# Patient Record
Sex: Female | Born: 1965 | Hispanic: No | Smoking: Current every day smoker
Health system: Southern US, Community
[De-identification: ages and names within clinical notes are randomized; demographics above are authoritative.]

## PROBLEM LIST (undated history)

## (undated) DIAGNOSIS — M1990 Unspecified osteoarthritis, unspecified site: Secondary | ICD-10-CM

## (undated) DIAGNOSIS — M5136 Other intervertebral disc degeneration, lumbar region: Secondary | ICD-10-CM

## (undated) DIAGNOSIS — F32A Depression, unspecified: Secondary | ICD-10-CM

## (undated) DIAGNOSIS — J189 Pneumonia, unspecified organism: Secondary | ICD-10-CM

## (undated) DIAGNOSIS — S92901A Unspecified fracture of right foot, initial encounter for closed fracture: Secondary | ICD-10-CM

## (undated) DIAGNOSIS — R519 Headache, unspecified: Secondary | ICD-10-CM

## (undated) DIAGNOSIS — F419 Anxiety disorder, unspecified: Secondary | ICD-10-CM

## (undated) DIAGNOSIS — D649 Anemia, unspecified: Secondary | ICD-10-CM

## (undated) DIAGNOSIS — I639 Cerebral infarction, unspecified: Secondary | ICD-10-CM

## (undated) DIAGNOSIS — M5126 Other intervertebral disc displacement, lumbar region: Secondary | ICD-10-CM

## (undated) HISTORY — PX: ABDOMINAL HYSTERECTOMY: SUR658

## (undated) HISTORY — DX: Other intervertebral disc displacement, lumbar region: M51.26

## (undated) HISTORY — DX: Unspecified fracture of right foot, initial encounter for closed fracture: S92.901A

## (undated) HISTORY — PX: HYSTEROTOMY: SHX1776

## (undated) HISTORY — DX: Other intervertebral disc degeneration, lumbar region: M51.36

## (undated) HISTORY — DX: Cerebral infarction, unspecified: I63.9

---

## 2017-10-31 ENCOUNTER — Telehealth: Payer: Self-pay | Admitting: Neurology

## 2017-10-31 ENCOUNTER — Encounter: Payer: Self-pay | Admitting: Neurology

## 2017-10-31 ENCOUNTER — Ambulatory Visit (INDEPENDENT_AMBULATORY_CARE_PROVIDER_SITE_OTHER): Payer: 59 | Admitting: Neurology

## 2017-10-31 VITALS — BP 160/94 | HR 82 | Ht 65.0 in | Wt 196.2 lb

## 2017-10-31 DIAGNOSIS — R269 Unspecified abnormalities of gait and mobility: Secondary | ICD-10-CM | POA: Diagnosis not present

## 2017-10-31 DIAGNOSIS — G3281 Cerebellar ataxia in diseases classified elsewhere: Secondary | ICD-10-CM | POA: Diagnosis not present

## 2017-10-31 DIAGNOSIS — M542 Cervicalgia: Secondary | ICD-10-CM | POA: Insufficient documentation

## 2017-10-31 MED ORDER — RIZATRIPTAN BENZOATE 10 MG PO TBDP: 10.0000 mg | ORAL_TABLET | ORAL | 6 refills | Status: DC | PRN

## 2017-10-31 MED ORDER — ONDANSETRON 4 MG PO TBDP: 4.0000 mg | ORAL_TABLET | Freq: Three times a day (TID) | ORAL | 6 refills | Status: DC | PRN

## 2017-10-31 MED ORDER — DULOXETINE HCL 60 MG PO CPEP: 60.0000 mg | ORAL_CAPSULE | Freq: Every day | ORAL | 12 refills | Status: DC

## 2017-10-31 MED ORDER — NORTRIPTYLINE HCL 25 MG PO CAPS: 50.0000 mg | ORAL_CAPSULE | Freq: Every day | ORAL | 11 refills | Status: DC

## 2017-10-31 NOTE — Telephone Encounter (Signed)
[employer] order sent to [facility]. They will obtain the auth and they will reach out to the pt to schedule.

## 2017-10-31 NOTE — Progress Notes (Signed)
PATIENT: Christine CavalierBetty Perkins DOB: 01/06/1966  Chief Complaint  Patient presents with  . Cerebrovascular Accident    She is here with her husband, Annette StableBill.  She would like to review her recent MRI showing stroke activity (completed on 10/19/17).  She has continued to have numbness/tingling in her right arm and an unsteady gait.  She is currently in a boot for a foot fracture.  She has not been referred to any type of therapy.  Marland Kitchen. PCP    Dulce SellarHudnell, Stephanie, NP     HISTORICAL  Christine CavalierBetty Perkins is a 52 year old female, seen in request by her primary care nurse practitioner Dulce SellarHudnell, Stephanie for evaluation of stroke, initial evaluation was on October 31, 2017.  Patient had gradual onset balance issues since January 2019, she also complains of neck pain, upper extremity, right hand numbness, weakness, difficulty making a tight grip, sometimes left hand paresthesia, she also complains of urinary urgency  She tripped and fell in June 2019 with right fifth metatarsal fracture,  [facility] on October 17, 2017, left frontal subcortical small vessel disease no acute abnormality  REVIEW OF SYSTEMS: Full 14 system review of systems performed and notable only for as above  ALLERGIES: No Known Allergies  HOME MEDICATIONS: Current Outpatient Medications  Medication Sig Dispense Refill  . cyclobenzaprine (FLEXERIL) 10 MG tablet Take 10 mg by mouth 3 (three) times daily as needed for muscle spasms.    . Ibuprofen-Famotidine (DUEXIS) 800-26.6 MG TABS Take by mouth 3 (three) times daily as needed.     No current facility-administered medications for this visit.     PAST MEDICAL HISTORY: Past Medical History:  Diagnosis Date  . Bulging lumbar disc   . CVA (cerebral vascular accident) (HCC)   . Foot fracture, right     PAST SURGICAL HISTORY: Past Surgical History:  Procedure Laterality Date  . ABDOMINAL HYSTERECTOMY     partial  . HYSTEROTOMY     partial     FAMILY HISTORY: Family History  Problem Relation Age of Onset  . Hypertension Mother   . Heart attack Mother   . COPD Mother   . Lung cancer Father   . COPD Father     SOCIAL HISTORY: Social History   Socioeconomic History  . Marital status: Unknown    Spouse name: Not on file  . Number of children: 1  . Years of education: 3912  . Highest education level: High school graduate  Occupational History  . Occupation: Educational psychologistatch worker  Social Needs  . Financial resource strain: Not on file  . Food insecurity:    Worry: Not on file    Inability: Not on file  . Transportation needs:    Medical: Not on file    Non-medical: Not on file  Tobacco Use  . Smoking status: Current Every Day Smoker    Types: Cigarettes  . Smokeless tobacco: Never Used  . Tobacco comment: 0.5 to 1 ppd  Substance and Sexual Activity  . Alcohol use: Yes    Comment: social  . Drug use: Never  . Sexual activity: Not on file  Lifestyle  . Physical activity:    Days per week: Not on file    Minutes per session: Not on file  . Stress: Not on file  Relationships  . Social connections:    Talks on phone: Not on file    Gets together: Not on file    Attends religious service: Not on file  Active member of club or organization: Not on file    Attends meetings of clubs or organizations: Not on file    Relationship status: Not on file  . Intimate partner violence:    Fear of current or ex partner: Not on file    Emotionally abused: Not on file    Physically abused: Not on file    Forced sexual activity: Not on file  Other Topics Concern  . Not on file  Social History Narrative   Lives at home with her husband, [name].   Right-handed.   One cup coffee twice weekly.     PHYSICAL EXAM   Vitals:   10/31/17 0846  BP: (!) 160/94  Pulse: 82  Weight: 196 lb 4 oz (89 kg)  Height: 5\' 5"  (1.651 m)    Not recorded      Body mass index is 32.66 kg/m.  PHYSICAL EXAMNIATION:  Gen: NAD,  conversant, well nourised, obese, well groomed                     Cardiovascular: Regular rate rhythm, no peripheral edema, warm, nontender. Eyes: Conjunctivae clear without exudates or hemorrhage Neck: Supple, no carotid bruits. Pulmonary: Clear to auscultation bilaterally   NEUROLOGICAL EXAM:  MENTAL STATUS: Speech:    Speech is normal; fluent and spontaneous with normal comprehension.  Cognition:     Orientation to time, place and person     Normal recent and remote memory     Normal Attention span and concentration     Normal Language, naming, repeating,spontaneous speech     Fund of knowledge   CRANIAL NERVES: CN II: Visual fields are full to confrontation. Fundoscopic exam is normal with sharp discs and no vascular changes. Pupils are round equal and briskly reactive to light. CN III, IV, VI: extraocular movement are normal. No ptosis. CN V: Facial sensation is intact to pinprick in all 3 divisions bilaterally. Corneal responses are intact.  CN VII: Face is symmetric with normal eye closure and smile. CN VIII: Hearing is normal to rubbing fingers CN IX, X: Palate elevates symmetrically. Phonation is normal. CN XI: Head turning and shoulder shrug are intact CN XII: Tongue is midline with normal movements and no atrophy.  MOTOR: There is no pronator drift of out-stretched arms. Muscle bulk and tone are normal. Muscle strength is normal.  REFLEXES: Reflexes are 3 and symmetric at the biceps, triceps, knees, and nonsustained left side ankle clonus, plantar responses are flexor on the left side.  SENSORY: Intact to light touch, pinprick, positional sensation and vibratory sensation are intact in fingers and toes.  COORDINATION: Rapid alternating movements and fine finger movements are intact. There is no dysmetria on finger-to-nose and heel-knee-shin.    GAIT/STANCE: Antalgic due to right fifth metatarsal fracture, wearing right boot   DIAGNOSTIC DATA (LABS, IMAGING,  TESTING) - I reviewed patient records, labs, notes, testing and imaging myself where available.   ASSESSMENT AND PLAN  Austine Kelsay is a 52 y.o. female   Gait abnormality  Need to rule out cervical spondylitic myelopathy  MRI of cervical spine     Levert Feinstein, M.D. Ph.D.  [facility] [address] [place], [place] [address] Ph: [phone] Fax: [phone]  CC: Dulce Sellar, NP

## 2017-11-09 ENCOUNTER — Other Ambulatory Visit: Payer: Self-pay | Admitting: Neurology

## 2017-11-12 ENCOUNTER — Ambulatory Visit
Admission: RE | Admit: 2017-11-12 | Discharge: 2017-11-12 | Disposition: A | Discharge status: Home / Self Care | Payer: Self-pay | Source: Ambulatory Visit | Attending: Neurology | Admitting: Neurology

## 2017-11-12 DIAGNOSIS — G3281 Cerebellar ataxia in diseases classified elsewhere: Secondary | ICD-10-CM

## 2017-11-15 ENCOUNTER — Telehealth: Payer: Self-pay | Admitting: Neurology

## 2017-11-15 ENCOUNTER — Telehealth: Payer: Self-pay | Admitting: *Deleted

## 2017-11-15 DIAGNOSIS — Z0289 Encounter for other administrative examinations: Secondary | ICD-10-CM

## 2017-11-15 DIAGNOSIS — G959 Disease of spinal cord, unspecified: Secondary | ICD-10-CM

## 2017-11-15 NOTE — Telephone Encounter (Signed)
Please call patient, MRI of the cervical spine showed multilevel degenerative changes, mostly severe at C5-6, there is evidence of moderate spinal canal stenosis, more to the right side, evidence of spinal cord signal change.  I will refer her to neurosurgery for evaluation.   IMPRESSION: This MRI of the cervical spine without contrast shows the following: 1.    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   Additionally there is moderately severe bilateral foraminal narrowing that could lead to compression of either of the C6 nerve roots. 2.    Mild spinal stenosis at C4-C5 due to disc bulging and congenitally short pedicles and borderline spinal stenosis at C3-C4 due to congenitally short pedicles.  There is no nerve root compression or significant foraminal narrowing at these levels.

## 2017-11-15 NOTE — Telephone Encounter (Signed)
[place] form on Michelle desk. 

## 2017-11-15 NOTE — Telephone Encounter (Signed)
Spoke to patient - she is aware of the results and agreeable to the referral to neurosurgery.

## 2017-11-16 NOTE — Telephone Encounter (Signed)
Paper work completed and returned to medical records. 

## 2017-11-17 NOTE — Telephone Encounter (Signed)
Patient has appt with Dr. Julio SicksHenry Pool at [facility] on 12/01/17.

## 2017-12-01 ENCOUNTER — Ambulatory Visit: Payer: 59 | Admitting: Neurology

## 2019-12-05 ENCOUNTER — Other Ambulatory Visit: Payer: Self-pay | Admitting: Specialist

## 2019-12-05 DIAGNOSIS — M4802 Spinal stenosis, cervical region: Secondary | ICD-10-CM

## 2019-12-05 DIAGNOSIS — R200 Anesthesia of skin: Secondary | ICD-10-CM

## 2019-12-05 DIAGNOSIS — M79602 Pain in left arm: Secondary | ICD-10-CM

## 2019-12-06 ENCOUNTER — Ambulatory Visit
Admission: RE | Admit: 2019-12-06 | Discharge: 2019-12-06 | Disposition: A | Discharge status: Home / Self Care | Payer: 59 | Source: Ambulatory Visit | Attending: Specialist | Admitting: Specialist

## 2019-12-06 ENCOUNTER — Other Ambulatory Visit: Payer: Self-pay

## 2019-12-06 DIAGNOSIS — R200 Anesthesia of skin: Secondary | ICD-10-CM

## 2019-12-06 DIAGNOSIS — M79602 Pain in left arm: Secondary | ICD-10-CM

## 2019-12-06 DIAGNOSIS — M4802 Spinal stenosis, cervical region: Secondary | ICD-10-CM

## 2020-08-26 ENCOUNTER — Other Ambulatory Visit: Payer: Self-pay | Admitting: Neurosurgery

## 2020-08-26 DIAGNOSIS — M4316 Spondylolisthesis, lumbar region: Secondary | ICD-10-CM

## 2020-08-26 DIAGNOSIS — G959 Disease of spinal cord, unspecified: Secondary | ICD-10-CM

## 2020-09-10 ENCOUNTER — Ambulatory Visit
Admission: RE | Admit: 2020-09-10 | Discharge: 2020-09-10 | Disposition: A | Discharge status: Home / Self Care | Payer: 59 | Source: Ambulatory Visit | Attending: Neurosurgery | Admitting: Neurosurgery

## 2020-09-10 DIAGNOSIS — M4316 Spondylolisthesis, lumbar region: Secondary | ICD-10-CM

## 2020-09-10 DIAGNOSIS — G959 Disease of spinal cord, unspecified: Secondary | ICD-10-CM

## 2020-09-22 ENCOUNTER — Other Ambulatory Visit: Payer: Self-pay | Admitting: Neurosurgery

## 2020-10-20 NOTE — Progress Notes (Signed)
Surgical Instructions    Your procedure is scheduled on Friday, July 29th, 2022 .  Report to [facility] Main Entrance "A" at 06:00 A.M., then check in with the Admitting office.  Call this number if you have problems the morning of surgery:  [phone]   If you have any questions prior to your surgery date call [phone]: Open Monday-Friday 8am-4pm    Remember:  Do not eat or drink after midnight the night before your surgery    Take these medicines the morning of surgery with A SIP OF WATER:  sertraline (ZOLOFT)  ALPRAZolam (XANAX) - if needed tizanidine (ZANAFLEX) - if needed  As of today, STOP taking any Aspirin (unless otherwise instructed by your surgeon) Aleve, Naproxen, Ibuprofen, Motrin, Advil, Goody's, BC's, all herbal medications, fish oil, and all vitamins.          Do not wear jewelry or makeup Do not wear lotions, powders, perfumes, or deodorant. Do not shave 48 hours prior to surgery.   Do not bring valuables to the hospital. DO Not wear nail polish, gel polish, artificial nails, or any other type of covering on natural nails including finger and toenails. If patients have artificial nails, gel coating, etc. that need to be removed by a nail salon please have this removed prior to surgery or surgery may need to be canceled/delayed if the surgeon/ anesthesia feels like the patient is unable to be adequately monitored.             [facility] is not responsible for any belongings or valuables.  Do NOT Smoke (Tobacco/Vaping) or drink Alcohol 24 hours prior to your procedure If you use a CPAP at night, you may bring all equipment for your overnight stay.   Contacts, glasses, dentures or bridgework may not be worn into surgery, please bring cases for these belongings   For patients admitted to the hospital, discharge time will be determined by your treatment team.   Patients discharged the day of surgery will not be allowed to drive home, and someone needs to  stay with them for 24 hours.  ONLY 1 SUPPORT PERSON MAY BE PRESENT WHILE YOU ARE IN SURGERY. IF YOU ARE TO BE ADMITTED ONCE YOU ARE IN YOUR ROOM YOU WILL BE ALLOWED TWO (2) VISITORS.  Minor children may have two parents present. Special consideration for safety and communication needs will be reviewed on a case by case basis.  Special instructions:    Oral Hygiene is also important to reduce your risk of infection.  Remember - BRUSH YOUR TEETH THE MORNING OF SURGERY WITH YOUR REGULAR TOOTHPASTE   [place]- Preparing For Surgery  Before surgery, you can play an important role. Because skin is not sterile, your skin needs to be as free of germs as possible. You can reduce the number of germs on your skin by washing with CHG (chlorahexidine gluconate) Soap before surgery.  CHG is an antiseptic cleaner which kills germs and bonds with the skin to continue killing germs even after washing.     Please do not use if you have an allergy to CHG or antibacterial soaps. If your skin becomes reddened/irritated stop using the CHG.  Do not shave (including legs and underarms) for at least 48 hours prior to first CHG shower. It is OK to shave your face.  Please follow these instructions carefully.     Shower the NIGHT BEFORE SURGERY and the MORNING OF SURGERY with CHG Soap.   If you chose to  wash your hair, wash your hair first as usual with your normal shampoo. After you shampoo, rinse your hair and body thoroughly to remove the shampoo.  Then [employer] and genitals (private parts) with your normal soap and rinse thoroughly to remove soap.  After that Use CHG Soap as you would any other liquid soap. You can apply CHG directly to the skin and wash gently with a scrungie or a clean washcloth.   Apply the CHG Soap to your body ONLY FROM THE NECK DOWN.  Do not use on open wounds or open sores. Avoid contact with your eyes, ears, mouth and genitals (private parts). Wash Face and genitals (private parts)   with your normal soap.   Wash thoroughly, paying special attention to the area where your surgery will be performed.  Thoroughly rinse your body with warm water from the neck down.  DO NOT shower/wash with your normal soap after using and rinsing off the CHG Soap.  Pat yourself dry with a CLEAN TOWEL.  Wear CLEAN PAJAMAS to bed the night before surgery  Place CLEAN SHEETS on your bed the night before your surgery  DO NOT SLEEP WITH PETS.   Day of Surgery:  Take a shower with CHG soap. Wear Clean/Comfortable clothing the morning of surgery Do not apply any deodorants/lotions.   Remember to brush your teeth WITH YOUR REGULAR TOOTHPASTE.   Please read over the following fact sheets that you were given.

## 2020-10-21 ENCOUNTER — Encounter (HOSPITAL_COMMUNITY): Payer: Self-pay

## 2020-10-21 ENCOUNTER — Encounter (HOSPITAL_COMMUNITY)
Admission: RE | Admit: 2020-10-21 | Discharge: 2020-10-21 | Disposition: A | Discharge status: Home / Self Care | Payer: 59 | Source: Ambulatory Visit | Attending: Neurosurgery | Admitting: Neurosurgery

## 2020-10-21 ENCOUNTER — Other Ambulatory Visit: Payer: Self-pay

## 2020-10-21 DIAGNOSIS — Z01818 Encounter for other preprocedural examination: Secondary | ICD-10-CM | POA: Diagnosis not present

## 2020-10-21 DIAGNOSIS — U071 COVID-19: Secondary | ICD-10-CM | POA: Insufficient documentation

## 2020-10-21 HISTORY — DX: Unspecified osteoarthritis, unspecified site: M19.90

## 2020-10-21 HISTORY — DX: Anemia, unspecified: D64.9

## 2020-10-21 HISTORY — DX: Anxiety disorder, unspecified: F41.9

## 2020-10-21 HISTORY — DX: Headache, unspecified: R51.9

## 2020-10-21 HISTORY — DX: Depression, unspecified: F32.A

## 2020-10-21 HISTORY — DX: Pneumonia, unspecified organism: J18.9

## 2020-10-21 LAB — CBC WITH DIFFERENTIAL/PLATELET
Abs Immature Granulocytes: 0.01 10*3/uL (ref 0.00–0.07)
Basophils Absolute: 0 10*3/uL (ref 0.0–0.1)
Basophils Relative: 1 %
Eosinophils Absolute: 0.2 10*3/uL (ref 0.0–0.5)
Eosinophils Relative: 3 %
HCT: 41.5 % (ref 36.0–46.0)
Hemoglobin: 13.9 g/dL (ref 12.0–15.0)
Immature Granulocytes: 0 %
Lymphocytes Relative: 60 %
Lymphs Abs: 3.6 10*3/uL (ref 0.7–4.0)
MCH: 32.9 pg (ref 26.0–34.0)
MCHC: 33.5 g/dL (ref 30.0–36.0)
MCV: 98.1 fL (ref 80.0–100.0)
Monocytes Absolute: 0.3 10*3/uL (ref 0.1–1.0)
Monocytes Relative: 6 %
Neutro Abs: 1.8 10*3/uL (ref 1.7–7.7)
Neutrophils Relative %: 30 %
Platelets: 257 10*3/uL (ref 150–400)
RBC: 4.23 MIL/uL (ref 3.87–5.11)
RDW: 14.4 % (ref 11.5–15.5)
WBC: 5.9 10*3/uL (ref 4.0–10.5)
nRBC: 0 % (ref 0.0–0.2)

## 2020-10-21 LAB — SURGICAL PCR SCREEN
MRSA, PCR: NEGATIVE
Staphylococcus aureus: NEGATIVE

## 2020-10-21 LAB — SARS CORONAVIRUS 2 (TAT 6-24 HRS): SARS Coronavirus 2: POSITIVE — AB

## 2020-10-21 NOTE — Progress Notes (Addendum)
PCP - Feliciana Rossetti, MD Cardiologist - denies  PPM/ICD - denies Device Orders - N/A Rep Notified - N/A  Chest x-ray - N/A EKG - 10/21/2020 Stress Test - more than 10 years ago - negative result per patient ECHO - denies Cardiac Cath - denies  Sleep Study - denies CPAP - N/A  Fasting Blood Sugar - N/A  Blood Thinner Instructions: N/A  Aspirin Instructions: Patient was instructed: As of today, STOP taking any Aspirin (unless otherwise instructed by your surgeon) Aleve, Naproxen, Ibuprofen, Motrin, Advil, Goody's, BC's, all herbal medications, fish oil, and all vitamins.  ERAS Protcol - No  COVID TEST- 10/21/2020   Anesthesia review: yes; requested information from Dr. Feliciana Rossetti office. Patient verbalized that she doesn't have Hypertension but in a note from [facility] (10/01/2020) there is a diagnostic of Essential Hypertension. BP in PAT was 153/77. She is not on any medicine for Hypertension. Patient didn't have any symptoms in PAT, no complaints.   Patient denies shortness of breath, fever, cough and chest pain at PAT appointment   All instructions explained to the patient, with a verbal understanding of the material. Patient agrees to go over the instructions while at home for a better understanding. Patient also instructed to self quarantine after being tested for COVID-19. The opportunity to ask questions was provided.

## 2020-10-22 LAB — TYPE AND SCREEN
ABO/RH(D): O NEG
Antibody Screen: NEGATIVE

## 2020-10-22 NOTE — Progress Notes (Signed)
Erie Noe at Dr Lindalou Hose office made aware of patient's covid test results.  Erie Noe stated she will reach out to patient and case will need to be rescheduled.

## 2020-10-31 ENCOUNTER — Encounter (HOSPITAL_COMMUNITY): Payer: Self-pay | Admitting: Neurosurgery

## 2020-10-31 ENCOUNTER — Other Ambulatory Visit: Payer: Self-pay

## 2020-10-31 NOTE — Progress Notes (Signed)
PCP - Dr. Shary Decamp  Cardiologist - denies EKG - 10/21/20 Chest x-ray -  ECHO -  Cardiac Cath -  CPAP -   ERAS Protcol - no  COVID TEST- +covid 7/26  Anesthesia review: n/a  -------------  SDW INSTRUCTIONS:  Your procedure is scheduled on 11/03/20. Please report to [facility] Main Entrance "A" at 0530 A.M., and check in at the Admitting office. Call this number if you have problems the morning of surgery: [phone]   Remember: Do not eat or drink after midnight the night before your surgery    Medications to take morning of surgery with a sip of water include: sertraline (ZOLOFT)   If needed: ALPRAZolam Prudy Feeler) tizanidine (ZANAFLEX)   As of today, STOP taking any Aspirin (unless otherwise instructed by your surgeon), Aleve, Naproxen, Ibuprofen, Motrin, Advil, Goody's, BC's, all herbal medications, fish oil, and all vitamins.    The Morning of Surgery Do not wear jewelry, make-up or nail polish. Do not wear lotions, powders, or perfumes, or deodorant Do not shave 48 hours prior to surgery.   Do not bring valuables to the hospital. [facility] is not responsible for any belongings or valuables.  If you are a smoker, DO NOT Smoke 24 hours prior to surgery  If you wear a CPAP at night please bring your mask the morning of surgery   Remember that you must have someone to transport you home after your surgery, and remain with you for 24 hours if you are discharged the same day.  Please bring cases for contacts, glasses, hearing aids, dentures or bridgework because it cannot be worn into surgery.   Patients discharged the day of surgery will not be allowed to drive home.   Please shower the NIGHT BEFORE/MORNING OF SURGERY (use antibacterial soap like DIAL soap if possible). Wear comfortable clothes the morning of surgery. Oral Hygiene is also important to reduce your risk of infection.  Remember - BRUSH YOUR TEETH THE MORNING OF SURGERY WITH YOUR REGULAR  TOOTHPASTE  Patient denies shortness of breath, fever, cough and chest pain.

## 2020-11-03 ENCOUNTER — Inpatient Hospital Stay (HOSPITAL_COMMUNITY): Payer: 59 | Admitting: Physician Assistant

## 2020-11-03 ENCOUNTER — Inpatient Hospital Stay (HOSPITAL_COMMUNITY)
Admission: RE | Admit: 2020-11-03 | Discharge: 2020-11-04 | DRG: 455 | Disposition: A | Discharge status: Home / Self Care | Payer: 59 | Source: Ambulatory Visit | Attending: Neurosurgery | Admitting: Neurosurgery

## 2020-11-03 ENCOUNTER — Inpatient Hospital Stay (HOSPITAL_COMMUNITY): Payer: 59

## 2020-11-03 ENCOUNTER — Inpatient Hospital Stay (HOSPITAL_COMMUNITY): Payer: 59 | Admitting: Anesthesiology

## 2020-11-03 ENCOUNTER — Encounter (HOSPITAL_COMMUNITY)
Admission: RE | Disposition: A | Discharge status: Home / Self Care | Payer: Self-pay | Source: Ambulatory Visit | Attending: Neurosurgery

## 2020-11-03 ENCOUNTER — Other Ambulatory Visit: Payer: Self-pay

## 2020-11-03 ENCOUNTER — Encounter (HOSPITAL_COMMUNITY): Payer: Self-pay | Admitting: Neurosurgery

## 2020-11-03 DIAGNOSIS — M5416 Radiculopathy, lumbar region: Secondary | ICD-10-CM | POA: Diagnosis present

## 2020-11-03 DIAGNOSIS — F32A Depression, unspecified: Secondary | ICD-10-CM | POA: Diagnosis present

## 2020-11-03 DIAGNOSIS — F17210 Nicotine dependence, cigarettes, uncomplicated: Secondary | ICD-10-CM | POA: Diagnosis present

## 2020-11-03 DIAGNOSIS — F419 Anxiety disorder, unspecified: Secondary | ICD-10-CM | POA: Diagnosis present

## 2020-11-03 DIAGNOSIS — Z79899 Other long term (current) drug therapy: Secondary | ICD-10-CM

## 2020-11-03 DIAGNOSIS — M4316 Spondylolisthesis, lumbar region: Secondary | ICD-10-CM | POA: Diagnosis present

## 2020-11-03 DIAGNOSIS — M48061 Spinal stenosis, lumbar region without neurogenic claudication: Secondary | ICD-10-CM | POA: Diagnosis present

## 2020-11-03 DIAGNOSIS — Z8673 Personal history of transient ischemic attack (TIA), and cerebral infarction without residual deficits: Secondary | ICD-10-CM

## 2020-11-03 DIAGNOSIS — M4310 Spondylolisthesis, site unspecified: Secondary | ICD-10-CM | POA: Diagnosis present

## 2020-11-03 DIAGNOSIS — Z419 Encounter for procedure for purposes other than remedying health state, unspecified: Secondary | ICD-10-CM

## 2020-11-03 LAB — BASIC METABOLIC PANEL
Anion gap: 11 (ref 5–15)
BUN: 17 mg/dL (ref 6–20)
CO2: 22 mmol/L (ref 22–32)
Calcium: 9.3 mg/dL (ref 8.9–10.3)
Chloride: 105 mmol/L (ref 98–111)
Creatinine, Ser: 0.78 mg/dL (ref 0.44–1.00)
GFR, Estimated: >60 mL/min (ref >60)
Glucose, Bld: 80 mg/dL (ref 70–99)
Potassium: 5.5 mmol/L — ABNORMAL HIGH (ref 3.5–5.1)
Sodium: 138 mmol/L (ref 135–145)

## 2020-11-03 LAB — ABO/RH: ABO/RH(D): O NEG

## 2020-11-03 SURGERY — POSTERIOR LUMBAR FUSION 2 LEVEL
Anesthesia: General | Site: Back

## 2020-11-03 MED ORDER — CHLORHEXIDINE GLUCONATE 0.12 % MT SOLN
OROMUCOSAL | Status: AC
  Administered 2020-11-03: 15 mL via OROMUCOSAL
  Filled 2020-11-03: qty 15

## 2020-11-03 MED ORDER — CHLORHEXIDINE GLUCONATE CLOTH 2 % EX PADS: 6.0000 | MEDICATED_PAD | Freq: Once | CUTANEOUS | Status: DC

## 2020-11-03 MED ORDER — MENTHOL 3 MG MT LOZG: 1.0000 | LOZENGE | OROMUCOSAL | Status: DC | PRN

## 2020-11-03 MED ORDER — LIDOCAINE 2% (20 MG/ML) 5 ML SYRINGE
INTRAMUSCULAR | Status: DC | PRN
Start: 2020-11-03 — End: 2020-11-03
  Administered 2020-11-03: 80 mg via INTRAVENOUS

## 2020-11-03 MED ORDER — ACETAMINOPHEN 500 MG PO TABS
1000.0000 mg | ORAL_TABLET | Freq: Once | ORAL | Status: AC
  Administered 2020-11-03: 1000 mg via ORAL
  Filled 2020-11-03: qty 2

## 2020-11-03 MED ORDER — 0.9 % SODIUM CHLORIDE (POUR BTL) OPTIME
TOPICAL | Status: DC | PRN
  Administered 2020-11-03: 1000 mL

## 2020-11-03 MED ORDER — SERTRALINE HCL 50 MG PO TABS: 50.0000 mg | ORAL_TABLET | Freq: Every day | ORAL | Status: DC

## 2020-11-03 MED ORDER — VITAMIN D 25 MCG (1000 UNIT) PO TABS: 2000.0000 [IU] | ORAL_TABLET | Freq: Every day | ORAL | Status: DC

## 2020-11-03 MED ORDER — SODIUM CHLORIDE 0.9% FLUSH: 3.0000 mL | INTRAVENOUS | Status: DC | PRN

## 2020-11-03 MED ORDER — FENTANYL CITRATE (PF) 250 MCG/5ML IJ SOLN
INTRAMUSCULAR | Status: DC | PRN
  Administered 2020-11-03: 50 ug via INTRAVENOUS
  Administered 2020-11-03: 100 ug via INTRAVENOUS
  Administered 2020-11-03: 50 ug via INTRAVENOUS

## 2020-11-03 MED ORDER — ROCURONIUM BROMIDE 10 MG/ML (PF) SYRINGE
PREFILLED_SYRINGE | INTRAVENOUS | Status: AC
  Filled 2020-11-03: qty 10

## 2020-11-03 MED ORDER — POLYETHYLENE GLYCOL 3350 17 G PO PACK: 17.0000 g | PACK | Freq: Every day | ORAL | Status: DC | PRN

## 2020-11-03 MED ORDER — ALBUTEROL SULFATE HFA 108 (90 BASE) MCG/ACT IN AERS
INHALATION_SPRAY | RESPIRATORY_TRACT | Status: AC
  Filled 2020-11-03: qty 6.7

## 2020-11-03 MED ORDER — ACETAMINOPHEN 325 MG PO TABS
650.0000 mg | ORAL_TABLET | ORAL | Status: DC | PRN
  Administered 2020-11-04: 650 mg via ORAL
  Filled 2020-11-03: qty 2

## 2020-11-03 MED ORDER — PROPOFOL 10 MG/ML IV BOLUS
INTRAVENOUS | Status: AC
  Filled 2020-11-03: qty 20

## 2020-11-03 MED ORDER — VANCOMYCIN HCL 1000 MG IV SOLR
INTRAVENOUS | Status: AC
  Filled 2020-11-03: qty 1000

## 2020-11-03 MED ORDER — THROMBIN 20000 UNITS EX SOLR
CUTANEOUS | Status: AC
  Filled 2020-11-03: qty 20000

## 2020-11-03 MED ORDER — ROCURONIUM BROMIDE 10 MG/ML (PF) SYRINGE
PREFILLED_SYRINGE | INTRAVENOUS | Status: DC | PRN
  Administered 2020-11-03: 40 mg via INTRAVENOUS
  Administered 2020-11-03: 100 mg via INTRAVENOUS

## 2020-11-03 MED ORDER — OXYCODONE HCL 5 MG PO TABS
5.0000 mg | ORAL_TABLET | Freq: Once | ORAL | Status: AC | PRN
  Administered 2020-11-03: 5 mg via ORAL

## 2020-11-03 MED ORDER — ALBUMIN HUMAN 5 % IV SOLN: INTRAVENOUS | Status: DC | PRN

## 2020-11-03 MED ORDER — BISACODYL 10 MG RE SUPP: 10.0000 mg | Freq: Every day | RECTAL | Status: DC | PRN

## 2020-11-03 MED ORDER — SODIUM CHLORIDE 0.9% FLUSH: 3.0000 mL | Freq: Two times a day (BID) | INTRAVENOUS | Status: DC

## 2020-11-03 MED ORDER — ONDANSETRON HCL 4 MG PO TABS: 4.0000 mg | ORAL_TABLET | Freq: Four times a day (QID) | ORAL | Status: DC | PRN

## 2020-11-03 MED ORDER — SUGAMMADEX SODIUM 200 MG/2ML IV SOLN
INTRAVENOUS | Status: DC | PRN
  Administered 2020-11-03: 200 mg via INTRAVENOUS

## 2020-11-03 MED ORDER — KETAMINE HCL 50 MG/5ML IJ SOSY
PREFILLED_SYRINGE | INTRAMUSCULAR | Status: AC
  Filled 2020-11-03: qty 5

## 2020-11-03 MED ORDER — PHENYLEPHRINE HCL-NACL 20-0.9 MG/250ML-% IV SOLN
INTRAVENOUS | Status: DC | PRN
  Administered 2020-11-03: 10 ug/min via INTRAVENOUS

## 2020-11-03 MED ORDER — MIDAZOLAM HCL 5 MG/5ML IJ SOLN
INTRAMUSCULAR | Status: DC | PRN
  Administered 2020-11-03: 2 mg via INTRAVENOUS

## 2020-11-03 MED ORDER — PROMETHAZINE HCL 25 MG/ML IJ SOLN: 6.2500 mg | INTRAMUSCULAR | Status: DC | PRN

## 2020-11-03 MED ORDER — OXYCODONE HCL 5 MG PO TABS
10.0000 mg | ORAL_TABLET | ORAL | Status: DC | PRN
  Administered 2020-11-04: 10 mg via ORAL
  Filled 2020-11-03: qty 2

## 2020-11-03 MED ORDER — HYDROCODONE-ACETAMINOPHEN 10-325 MG PO TABS: 1.0000 | ORAL_TABLET | ORAL | Status: DC | PRN

## 2020-11-03 MED ORDER — OXYCODONE HCL 5 MG/5ML PO SOLN: 5.0000 mg | Freq: Once | ORAL | Status: AC | PRN

## 2020-11-03 MED ORDER — BUPIVACAINE HCL (PF) 0.25 % IJ SOLN
INTRAMUSCULAR | Status: DC | PRN
  Administered 2020-11-03: 20 mL

## 2020-11-03 MED ORDER — LIDOCAINE 2% (20 MG/ML) 5 ML SYRINGE
INTRAMUSCULAR | Status: AC
  Filled 2020-11-03: qty 5

## 2020-11-03 MED ORDER — KETAMINE HCL 10 MG/ML IJ SOLN
INTRAMUSCULAR | Status: DC | PRN
  Administered 2020-11-03: 10 mg via INTRAVENOUS
  Administered 2020-11-03: 20 mg via INTRAVENOUS
  Administered 2020-11-03: 10 mg via INTRAVENOUS

## 2020-11-03 MED ORDER — ORAL CARE MOUTH RINSE: 15.0000 mL | Freq: Once | OROMUCOSAL | Status: AC

## 2020-11-03 MED ORDER — CHLORHEXIDINE GLUCONATE 0.12 % MT SOLN: 15.0000 mL | Freq: Once | OROMUCOSAL | Status: AC

## 2020-11-03 MED ORDER — ONDANSETRON HCL 4 MG/2ML IJ SOLN
INTRAMUSCULAR | Status: AC
  Filled 2020-11-03: qty 2

## 2020-11-03 MED ORDER — HYDROMORPHONE HCL 1 MG/ML IJ SOLN: 1.0000 mg | INTRAMUSCULAR | Status: DC | PRN

## 2020-11-03 MED ORDER — LACTATED RINGERS IV SOLN: INTRAVENOUS | Status: DC

## 2020-11-03 MED ORDER — EPHEDRINE SULFATE-NACL 50-0.9 MG/10ML-% IV SOSY
PREFILLED_SYRINGE | INTRAVENOUS | Status: DC | PRN
  Administered 2020-11-03 (×5): 5 mg via INTRAVENOUS

## 2020-11-03 MED ORDER — CEFAZOLIN SODIUM-DEXTROSE 2-4 GM/100ML-% IV SOLN
2.0000 g | INTRAVENOUS | Status: AC
  Administered 2020-11-03: 2 g via INTRAVENOUS

## 2020-11-03 MED ORDER — DEXAMETHASONE SODIUM PHOSPHATE 10 MG/ML IJ SOLN
INTRAMUSCULAR | Status: AC
  Filled 2020-11-03: qty 1

## 2020-11-03 MED ORDER — MIDAZOLAM HCL 2 MG/2ML IJ SOLN
INTRAMUSCULAR | Status: AC
  Filled 2020-11-03: qty 2

## 2020-11-03 MED ORDER — ONDANSETRON HCL 4 MG/2ML IJ SOLN
INTRAMUSCULAR | Status: DC | PRN
  Administered 2020-11-03: 4 mg via INTRAVENOUS

## 2020-11-03 MED ORDER — PROPOFOL 10 MG/ML IV BOLUS
INTRAVENOUS | Status: DC | PRN
  Administered 2020-11-03: 150 mg via INTRAVENOUS

## 2020-11-03 MED ORDER — DEXAMETHASONE SODIUM PHOSPHATE 10 MG/ML IJ SOLN
10.0000 mg | Freq: Once | INTRAMUSCULAR | Status: AC
  Administered 2020-11-03: 10 mg via INTRAVENOUS

## 2020-11-03 MED ORDER — THROMBIN 20000 UNITS EX SOLR: CUTANEOUS | Status: DC | PRN

## 2020-11-03 MED ORDER — CEFAZOLIN SODIUM-DEXTROSE 2-4 GM/100ML-% IV SOLN
INTRAVENOUS | Status: AC
  Filled 2020-11-03: qty 100

## 2020-11-03 MED ORDER — FENTANYL CITRATE (PF) 250 MCG/5ML IJ SOLN
INTRAMUSCULAR | Status: AC
  Filled 2020-11-03: qty 5

## 2020-11-03 MED ORDER — CEFAZOLIN SODIUM-DEXTROSE 1-4 GM/50ML-% IV SOLN
1.0000 g | Freq: Three times a day (TID) | INTRAVENOUS | Status: AC
  Administered 2020-11-03 – 2020-11-04 (×2): 1 g via INTRAVENOUS
  Filled 2020-11-03 (×2): qty 50

## 2020-11-03 MED ORDER — FLEET ENEMA 7-19 GM/118ML RE ENEM: 1.0000 | ENEMA | Freq: Once | RECTAL | Status: DC | PRN

## 2020-11-03 MED ORDER — HYDROMORPHONE HCL 1 MG/ML IJ SOLN
INTRAMUSCULAR | Status: AC
  Filled 2020-11-03: qty 1

## 2020-11-03 MED ORDER — OXYCODONE HCL 5 MG PO TABS
ORAL_TABLET | ORAL | Status: AC
  Filled 2020-11-03: qty 1

## 2020-11-03 MED ORDER — DIAZEPAM 5 MG PO TABS: 5.0000 mg | ORAL_TABLET | Freq: Four times a day (QID) | ORAL | Status: DC | PRN

## 2020-11-03 MED ORDER — ACETAMINOPHEN 650 MG RE SUPP: 650.0000 mg | RECTAL | Status: DC | PRN

## 2020-11-03 MED ORDER — AMISULPRIDE (ANTIEMETIC) 5 MG/2ML IV SOLN: 10.0000 mg | Freq: Once | INTRAVENOUS | Status: DC | PRN

## 2020-11-03 MED ORDER — PHENOL 1.4 % MT LIQD: 1.0000 | OROMUCOSAL | Status: DC | PRN

## 2020-11-03 MED ORDER — BUPIVACAINE HCL (PF) 0.25 % IJ SOLN
INTRAMUSCULAR | Status: AC
  Filled 2020-11-03: qty 30

## 2020-11-03 MED ORDER — VANCOMYCIN HCL 1000 MG IV SOLR
INTRAVENOUS | Status: DC | PRN
  Administered 2020-11-03: 1000 mg via TOPICAL

## 2020-11-03 MED ORDER — HYDROMORPHONE HCL 1 MG/ML IJ SOLN
0.2500 mg | INTRAMUSCULAR | Status: DC | PRN
  Administered 2020-11-03: 0.5 mg via INTRAVENOUS

## 2020-11-03 MED ORDER — SODIUM CHLORIDE 0.9 % IV SOLN: 250.0000 mL | INTRAVENOUS | Status: DC

## 2020-11-03 MED ORDER — ONDANSETRON HCL 4 MG/2ML IJ SOLN: 4.0000 mg | Freq: Four times a day (QID) | INTRAMUSCULAR | Status: DC | PRN

## 2020-11-03 MED ORDER — ALPRAZOLAM 0.25 MG PO TABS: 0.2500 mg | ORAL_TABLET | Freq: Three times a day (TID) | ORAL | Status: DC | PRN

## 2020-11-03 SURGICAL SUPPLY — 68 items
BAG COUNTER SPONGE SURGICOUNT (BAG) ×3 IMPLANT
BAG DECANTER FOR FLEXI CONT (MISCELLANEOUS) IMPLANT
BENZOIN TINCTURE PRP APPL 2/3 (GAUZE/BANDAGES/DRESSINGS) ×3 IMPLANT
BLADE CLIPPER SURG (BLADE) IMPLANT
BONE GRAFTON DBF INJECT 6CC (Bone Implant) ×6 IMPLANT
BUR CUTTER 7.0 ROUND (BURR) IMPLANT
BUR MATCHSTICK NEURO 3.0 LAGG (BURR) ×3 IMPLANT
CAGE EXP CATALYFT 9 (Plate) ×6 IMPLANT
CANISTER SUCT 3000ML PPV (MISCELLANEOUS) ×3 IMPLANT
CAP LCK SPNE (Orthopedic Implant) ×12 IMPLANT
CAP LOCK SPINE RADIUS (Orthopedic Implant) ×12 IMPLANT
CAP LOCKING (Orthopedic Implant) ×18 IMPLANT
CARTRIDGE OIL MAESTRO DRILL (MISCELLANEOUS) ×2 IMPLANT
CNTNR URN SCR LID CUP LEK RST (MISCELLANEOUS) ×2 IMPLANT
CONT SPEC 4OZ STRL OR WHT (MISCELLANEOUS) ×3
COVER BACK TABLE 60X90IN (DRAPES) ×3 IMPLANT
DECANTER SPIKE VIAL GLASS SM (MISCELLANEOUS) IMPLANT
DERMABOND ADVANCED (GAUZE/BANDAGES/DRESSINGS) ×1
DERMABOND ADVANCED .7 DNX12 (GAUZE/BANDAGES/DRESSINGS) ×2 IMPLANT
DIFFUSER DRILL AIR PNEUMATIC (MISCELLANEOUS) ×3 IMPLANT
DRAPE C-ARM 42X72 X-RAY (DRAPES) ×6 IMPLANT
DRAPE HALF SHEET 40X57 (DRAPES) IMPLANT
DRAPE LAPAROTOMY 100X72X124 (DRAPES) ×3 IMPLANT
DRAPE SURG 17X23 STRL (DRAPES) ×12 IMPLANT
DRSG OPSITE POSTOP 4X6 (GAUZE/BANDAGES/DRESSINGS) IMPLANT
DRSG OPSITE POSTOP 4X8 (GAUZE/BANDAGES/DRESSINGS) ×3 IMPLANT
DURAPREP 26ML APPLICATOR (WOUND CARE) ×3 IMPLANT
ELECT CAUTERY BLADE 6.4 (BLADE) ×3 IMPLANT
ELECT REM PT RETURN 9FT ADLT (ELECTROSURGICAL) ×3
ELECTRODE REM PT RTRN 9FT ADLT (ELECTROSURGICAL) ×2 IMPLANT
EVACUATOR 1/8 PVC DRAIN (DRAIN) IMPLANT
GAUZE 4X4 16PLY ~~LOC~~+RFID DBL (SPONGE) ×3 IMPLANT
GAUZE SPONGE 4X4 12PLY STRL (GAUZE/BANDAGES/DRESSINGS) IMPLANT
GLOVE SURG ENC MOIS LTX SZ6.5 (GLOVE) ×3 IMPLANT
GLOVE SURG LTX SZ9 (GLOVE) ×6 IMPLANT
GLOVE SURG POLYISO LF SZ7 (GLOVE) ×12 IMPLANT
GLOVE SURG UNDER POLY LF SZ6.5 (GLOVE) ×3 IMPLANT
GLOVE SURG UNDER POLY LF SZ7.5 (GLOVE) ×12 IMPLANT
GOWN STRL REUS W/ TWL LRG LVL3 (GOWN DISPOSABLE) ×2 IMPLANT
GOWN STRL REUS W/ TWL XL LVL3 (GOWN DISPOSABLE) ×8 IMPLANT
GOWN STRL REUS W/TWL 2XL LVL3 (GOWN DISPOSABLE) IMPLANT
GOWN STRL REUS W/TWL LRG LVL3 (GOWN DISPOSABLE) ×3
GOWN STRL REUS W/TWL XL LVL3 (GOWN DISPOSABLE) ×12
KIT BASIN OR (CUSTOM PROCEDURE TRAY) ×3 IMPLANT
KIT TURNOVER KIT B (KITS) ×3 IMPLANT
MILL MEDIUM DISP (BLADE) ×3 IMPLANT
NEEDLE HYPO 22GX1.5 SAFETY (NEEDLE) ×3 IMPLANT
NS IRRIG 1000ML POUR BTL (IV SOLUTION) ×3 IMPLANT
OIL CARTRIDGE MAESTRO DRILL (MISCELLANEOUS) ×3
PACK LAMINECTOMY NEURO (CUSTOM PROCEDURE TRAY) ×3 IMPLANT
PENCIL BUTTON HOLSTER BLD 10FT (ELECTRODE) ×3 IMPLANT
ROD 5.5X60MM PURPLE (Rod) ×3 IMPLANT
ROD 70MM (Rod) ×3 IMPLANT
ROD SPNL 70X5.5 NS TI RDS (Rod) ×2 IMPLANT
SCREW 5.75X40M (Screw) ×6 IMPLANT
SCREW 5.75X45MM (Screw) ×12 IMPLANT
SPACER PL CATALYFT LONG 11 (Spacer) ×6 IMPLANT
SPONGE SURGIFOAM ABS GEL 100 (HEMOSTASIS) ×3 IMPLANT
SPONGE T-LAP 4X18 ~~LOC~~+RFID (SPONGE) ×6 IMPLANT
STRIP CLOSURE SKIN 1/2X4 (GAUZE/BANDAGES/DRESSINGS) ×3 IMPLANT
SUT VIC AB 0 CT1 18XCR BRD8 (SUTURE) ×2 IMPLANT
SUT VIC AB 0 CT1 8-18 (SUTURE) ×3
SUT VIC AB 2-0 CT1 18 (SUTURE) ×3 IMPLANT
SUT VIC AB 3-0 SH 8-18 (SUTURE) ×6 IMPLANT
TOWEL GREEN STERILE (TOWEL DISPOSABLE) ×3 IMPLANT
TOWEL GREEN STERILE FF (TOWEL DISPOSABLE) ×3 IMPLANT
TRAY FOLEY MTR SLVR 16FR STAT (SET/KITS/TRAYS/PACK) ×3 IMPLANT
WATER STERILE IRR 1000ML POUR (IV SOLUTION) ×3 IMPLANT

## 2020-11-03 NOTE — H&P (Signed)
Christine Perkins is an 55 y.o. female.   Chief Complaint: Back pain HPI: 55 year old female with progressive back pain with bilateral lower extremity radicular symptoms failing conservative management her work-up demonstrates evidence of unstable degenerative spondylolisthesis at L3-4 and L4-5 with resultant facet arthropathy and stenosis.  Patient presents now for two-level lumbar decompression and fusion in hopes of improving her symptoms.  Past Medical History:  Diagnosis Date   Anemia    Anxiety    Arthritis    Bulging lumbar disc    CVA (cerebral vascular accident) (HCC)    Depression    Foot fracture, right    Headache    Pneumonia     Past Surgical History:  Procedure Laterality Date   ABDOMINAL HYSTERECTOMY     partial   ABDOMINAL HYSTERECTOMY     EYE SURGERY Bilateral    cataract   HYSTEROTOMY     partial    Family History  Problem Relation Age of Onset   Hypertension Mother    Heart attack Mother    COPD Mother    Lung cancer Father    COPD Father    Social History:  reports that she has been smoking cigarettes. She has been smoking an average of 1 pack per day. She has never used smokeless tobacco. She reports current alcohol use. She reports that she does not use drugs.  Allergies: No Known Allergies  Medications Prior to Admission  Medication Sig Dispense Refill   ALPRAZolam (XANAX) 0.5 MG tablet Take 0.25-0.5 mg by mouth 3 (three) times daily as needed for anxiety.     Cholecalciferol (VITAMIN D) 50 MCG (2000 UT) CAPS Take 2,000 Units by mouth daily.     sertraline (ZOLOFT) 50 MG tablet Take 50 mg by mouth daily.     tizanidine (ZANAFLEX) 6 MG capsule Take 6 mg by mouth every 6 (six) hours as needed for muscle spasms.     vitamin B-12 (CYANOCOBALAMIN) 1000 MCG tablet Take 1,000 mcg by mouth daily.      Results for orders placed or performed during the hospital encounter of 11/03/20 (from the past 48 hour(s))  ABO/Rh     Status: None   Collection Time:  11/03/20  9:00 AM  Result Value Ref Range   ABO/RH(D)      O NEG Performed at [facility], [address], [place], [place] [address]    No results found.  Pertinent items noted in HPI and remainder of comprehensive ROS otherwise negative.  Blood pressure (!) 141/61, pulse 66, temperature 97.6 F (36.4 C), temperature source Oral, resp. rate 17, height 5\' 4"  (1.626 m), weight 81.6 kg, SpO2 99 %.  Patient is awake and alert.  She is oriented and appropriate.  Speech is fluent.  Judgment insight are intact.  Cranial nerve function normal bilateral.  Motor examination extremities reveals intact motor strength bilaterally.  Sensory examination is nonfocal.  Deep tender versus normal active.  No evidence for long track signs.  Gait is antalgic.  Posture is mildly flexed peer examination head ears eyes nose and throat is unremarkable chest and abdomen are benign.  Extremities are free from injury or deformity. 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  Plan bilateral L3-4 and L4-5 decompressive laminotomies and foraminotomies followed by posterior lumbar MRI fusion utilizing interbody cages, local harvested autograft, and augmented with posterior lateral arthrodesis utilizing segmental pedicle screw fixation and local autografting.  Risks and benefits of been explained.  Patient wishes  to proceed.  Sherilyn Cooter A Ashlee Player 11/03/2020, 10:17 AM

## 2020-11-03 NOTE — Anesthesia Postprocedure Evaluation (Signed)
Anesthesia Post Note  Patient: Christine Perkins  Procedure(s) Performed: Posterior Lumbar Interbody Fusion Lumbar three-four, Lumbar four-five (Back) APPLICATION OF CELL SAVER     Patient location during evaluation: PACU Anesthesia Type: General Level of consciousness: awake Pain management: pain level controlled Vital Signs Assessment: post-procedure vital signs reviewed and stable Respiratory status: spontaneous breathing Cardiovascular status: stable Postop Assessment: no apparent nausea or vomiting Anesthetic complications: no   No notable events documented.  Last Vitals:  Vitals:   11/03/20 1530 11/03/20 1557  BP: (!) 116/59 (!) 119/55  Pulse: 83 (!) 55  Resp: 15 18  Temp: (!) 36.3 C (!) 36.4 C  SpO2: 93% 94%    Last Pain:  Vitals:   11/03/20 1557  TempSrc: Oral  PainSc: 2                  [name]

## 2020-11-03 NOTE — Progress Notes (Signed)
Patient resting quietly saturations 88% RA 02 2l applied via [place] saturations 96%

## 2020-11-03 NOTE — Progress Notes (Signed)
Orthopedic Tech Progress Note Patient Details:  Christine Perkins 26-Oct-1965 [id number]  PACU RN stated " patient has brace from [facility]'   Patient ID: Christine Perkins, female   DOB: 1966-03-29, 55 y.o.   MRN: [id number]  [name] 11/03/2020, 2:52 PM

## 2020-11-03 NOTE — Op Note (Signed)
Date of procedure: 11/03/2020  Date of dictation: Same  Service: Neurosurgery  Preoperative diagnosis: L3-4, L4-5 unstable degenerative spondylolisthesis with stenosis and radiculopathy  Postoperative diagnosis: Same  Procedure Name: Bilateral L3-4 and L4-5 decompressive laminotomies and foraminotomies, more than would be required for simple interbody fusion alone.  L3-4, L4-5 posterior lumbar interbody fusion utilizing interbody cages, local harvested autograft, and morselized allograft  L3-4-5 posterior lateral arthrodesis utilizing segmental pedicle screw fixation and local autografting.  Surgeon:Mikaella Escalona A.Freman Lapage, M.D.  Asst. Surgeon: Maisie Fus, MD; Doran Durand, NP  Anesthesia: General  Indication: 55 year old female with progressive worsening back and bilateral lower extremity symptoms failing conservative manage.  Work-up demonstrates evidence of unstable degenerative spondylolisthesis is at L3-4 and L4-5.  Patient is failed conservative management presents now for lumbar decompression and fusion in hopes improving her symptoms.  Operative note: After induction of anesthesia, patient position prone onto Wilson frame appropriate padded.  Lumbar region prepped and draped sterilely.  Incision made overlying L3-4-5.  Dissection performed bilaterally.  Retractor placed.  Fluoroscopy used.  Levels confirmed.  Decompressive laminotomies and facetectomies were then performed bilaterally at L3-4 and L4-5 to remove the inferior two thirds lamina of L3 and L4 bilaterally, the entire inferior facet and pars interarticularis of L3 and L4 bilaterally and the superior aspect of the lamina of L4 and L5 as well as the majority the superior facet of L4 and L5 bilaterally.  Ligament flavum elevated and resected.  Foraminotomies completed on the course exiting L4 and L5 and L3 nerve roots.  Bilateral discectomy was then performed at L3-4 and L4-5.  The space then prepared for interbody fusion.  With distractors placed  in patient's right side the space was then cleaned of all soft tissue on the left side.  11 mm [employer] expandable cages were impacted in the place on the left at L3-4 and on the left at L4-5 a 9 mm cage was used.  Distractor was removed patient's right side.  The space.  On the right side.  Morselized autograft was packed into each interspace.  Cages were then impacted in place on the right at L3-4 and L4-5.  Pedicles of L3-L4 and L5 were identified using surface landmarks and intraoperative fluoroscopy with suture bone The pedicle was then removed using high-speed drill each pedicle was then probed using a pedicle all each pedicle all track was then probed and found to be solidly within the bone.  Each pedicle tract was then tapped with a screw tap.  Screw pedicle was probed and found to be solid within bone.  5.75 mm radius brand screws from [employer] placed bilaterally at L3-L4 and L5.  Final images reveal good position the cages and the hardware at the proper upper level with normal alignment of spine.  Short segment titanium rods and placed over the screws at L3-L4 and L5.  Locking caps placed over the screws.  Locking caps then engaged with a construct under compression.  Transverse processes of L3-4 and 5 were decorticated.  Morselized autograft was packed posterior laterally.  Each cage was then filled with graft on putty.  Gelfoam was placed over the laminotomy defects.  Vancomycin powder placed in the deep wound space.  Wounds and closed in layers of Vicryl sutures.  Steri-Strips and sterile dressing were applied.  No apparent complications.  Patient tolerated the procedure well and she returns to recovery room postop.

## 2020-11-03 NOTE — Anesthesia Preprocedure Evaluation (Addendum)
Anesthesia Evaluation  Patient identified by MRN, date of birth, ID band Patient awake    Reviewed: Allergy & Precautions, NPO status , Patient's Chart, lab work & pertinent test results  Airway Mallampati: II  TM Distance: >3 FB Neck ROM: Full    Dental  (+) Edentulous Upper, Edentulous Lower   Pulmonary neg pulmonary ROS, Current Smoker,    Pulmonary exam normal breath sounds clear to auscultation       Cardiovascular negative cardio ROS Normal cardiovascular exam Rhythm:Regular Rate:Normal     Neuro/Psych  Headaches, PSYCHIATRIC DISORDERS Anxiety Depression CVA    GI/Hepatic negative GI ROS, Neg liver ROS,   Endo/Other  negative endocrine ROS  Renal/GU negative Renal ROS  negative genitourinary   Musculoskeletal  (+) Arthritis , Osteoarthritis,    Abdominal (+) + obese,   Peds negative pediatric ROS (+)  Hematology  (+) anemia ,   Anesthesia Other Findings   Reproductive/Obstetrics negative OB ROS                            Anesthesia Physical Anesthesia Plan  ASA: 2  Anesthesia Plan: General   Post-op Pain Management:    Induction: Intravenous  PONV Risk Score and Plan: 2 and Treatment may vary due to age or medical condition, Propofol infusion, TIVA, Ondansetron and Dexamethasone  Airway Management Planned: Oral ETT  Additional Equipment: None  Intra-op Plan:   Post-operative Plan: Extubation in OR  Informed Consent: I have reviewed the patients History and Physical, chart, labs and discussed the procedure including the risks, benefits and alternatives for the proposed anesthesia with the patient or authorized representative who has indicated his/her understanding and acceptance.     Dental advisory given  Plan Discussed with: CRNA, Anesthesiologist and Surgeon  Anesthesia Plan Comments:         Anesthesia Quick Evaluation

## 2020-11-03 NOTE — Anesthesia Procedure Notes (Signed)
Procedure Name: Intubation Date/Time: 11/03/2020 11:02 AM Performed by: Adria Dill, CRNA Pre-anesthesia Checklist: Patient identified, Emergency Drugs available, Suction available and Patient being monitored Patient Re-evaluated:Patient Re-evaluated prior to induction Oxygen Delivery Method: Circle system utilized Preoxygenation: Pre-oxygenation with 100% oxygen Induction Type: IV induction Ventilation: Mask ventilation without difficulty Laryngoscope Size: Miller and 2 Grade View: Grade I Tube type: Oral Tube size: 7.0 mm Number of attempts: 1 Airway Equipment and Method: Stylet and Oral airway Placement Confirmation: ETT inserted through vocal cords under direct vision, positive ETCO2 and breath sounds checked- equal and bilateral Secured at: 21 cm Tube secured with: Tape Dental Injury: Teeth and Oropharynx as per pre-operative assessment

## 2020-11-03 NOTE — Brief Op Note (Signed)
11/03/2020  2:03 PM  PATIENT:  Stasia Cavalier  55 y.o. female  PRE-OPERATIVE DIAGNOSIS:  Spondylolisthesis  POST-OPERATIVE DIAGNOSIS:  Spondylolisthesis  PROCEDURE:  Procedure(s): Posterior Lumbar Interbody Fusion Lumbar three-four, Lumbar four-five (N/A) APPLICATION OF CELL SAVER  SURGEON:  Surgeon(s) and Role:    Julio Sicks, MD - Primary  PHYSICIAN ASSISTANT:   ASSISTANTS: Thomas,MD;Bergman,NP   ANESTHESIA:   general  EBL:  350 mL   BLOOD ADMINISTERED:none  DRAINS: none   LOCAL MEDICATIONS USED:  MARCAINE     SPECIMEN:  No Specimen  DISPOSITION OF SPECIMEN:  N/A  COUNTS:  YES  TOURNIQUET:  * No tourniquets in log *  DICTATION: .Dragon Dictation  PLAN OF CARE: Admit to inpatient   PATIENT DISPOSITION:  PACU - hemodynamically stable.   Delay start of Pharmacological VTE agent (>24hrs) due to surgical blood loss or risk of bleeding: yes

## 2020-11-03 NOTE — Transfer of Care (Signed)
Immediate Anesthesia Transfer of Care Note  Patient: Christine Perkins  Procedure(s) Performed: Posterior Lumbar Interbody Fusion Lumbar three-four, Lumbar four-five (Back) APPLICATION OF CELL SAVER  Patient Location: PACU  Anesthesia Type:General  Level of Consciousness: drowsy and patient cooperative  Airway & Oxygen Therapy: Patient Spontanous Breathing and Patient connected to face mask oxygen  Post-op Assessment: Report given to RN and Post -op Vital signs reviewed and stable  Post vital signs: Reviewed and stable  Last Vitals:  Vitals Value Taken Time  BP 132/60 11/03/20 1416  Temp    Pulse 104 11/03/20 1420  Resp 19 11/03/20 1420  SpO2 100 % 11/03/20 1420  Vitals shown include unvalidated device data.  Last Pain:  Vitals:   11/03/20 0830  TempSrc:   PainSc: 0-No pain         Complications: No notable events documented.

## 2020-11-04 MED ORDER — OXYCODONE HCL 10 MG PO TABS: 10.0000 mg | ORAL_TABLET | ORAL | 0 refills | Status: AC | PRN

## 2020-11-04 MED ORDER — METHOCARBAMOL 500 MG PO TABS: 500.0000 mg | ORAL_TABLET | Freq: Four times a day (QID) | ORAL | 1 refills | Status: AC

## 2020-11-04 NOTE — Progress Notes (Signed)
Occupational Therapy Evaluation Patient Details Name: Christine Perkins MRN: [id number] DOB: [birth year] Today's Date: 11/04/2020    History of Present Illness Berdina Cheever  55 y.o. female s/p Posterior Lumbar Interbody Fusion Lumbar three-four 8/8. PT with history of  progressive back pain with bilateral lower extremity radicular symptoms, anxiety, arthritis, CVA, depression   Clinical Impression   Keiona was evaluated s/p the above back sx. PTA pt was indep in all ADL/IADLs including driving and working. She lives in a 1 level home with a ramped entrance, with her husband, children and grandchildren. Upon evaluation pt was supervision for all mobility given verbal cues for log rolling and safety. She performs ADLs with min A overall for compensatory techniques. Pt demonstrated good ability to adhere to all back precautions and verbalized understanding of brace wear/care schedule. Pt does not required further acute OT. Recommend d/c home with supervision for all Adls and mobility initially.     Follow Up Recommendations  No OT follow up;Supervision/Assistance - 24 hour    Equipment Recommendations  3 in 1 bedside commode       Precautions / Restrictions Precautions Precautions: Back;Fall Precaution Booklet Issued: Yes (comment) Required Braces or Orthoses: Spinal Brace Spinal Brace: Lumbar corset Restrictions Weight Bearing Restrictions: No Other Position/Activity Restrictions: no lifting more than 10 lbs      Mobility Bed Mobility Overal bed mobility: Needs Assistance Bed Mobility: Rolling;Sidelying to Sit;Sit to Sidelying Rolling: Supervision Sidelying to sit: Supervision;HOB elevated     Sit to sidelying: Supervision;HOB elevated General bed mobility comments: verbal cues throughout for log rolling - pt's bed at home elevates    Transfers Overall transfer level: Needs assistance   Transfers: Sit to/from Stand Sit to Stand: Modified independent (Device/Increase time)               Balance Overall balance assessment: Needs assistance Sitting-balance support: Feet supported Sitting balance-Leahy Scale: Good     Standing balance support: Single extremity supported Standing balance-Leahy Scale: Fair           ADL either performed or assessed with clinical judgement   ADL Overall ADL's : Needs assistance/impaired Eating/Feeding: Independent;Sitting   Grooming: Oral care;Cueing for compensatory techniques;Supervision/safety   Upper Body Bathing: Set up;Sitting   Lower Body Bathing: Minimal assistance;Cueing for compensatory techniques;Sit to/from stand   Upper Body Dressing : Set up;Sitting Upper Body Dressing Details (indicate cue type and reason): mod A for back brace Lower Body Dressing: Minimal assistance;Cueing for compensatory techniques;Sit to/from stand   Toilet Transfer: Supervision/safety;Ambulation   Toileting- Clothing Manipulation and Hygiene: Supervision/safety;Cueing for compensatory techniques       Functional mobility during ADLs: Supervision/safety;Cueing for safety General ADL Comments: verbal cues for compensatory techniques to maintain back precautions throughout     Vision Baseline Vision/History: No visual deficits Patient Visual Report: No change from baseline Vision Assessment?: No apparent visual deficits      Pertinent Vitals/Pain Pain Assessment: Faces Faces Pain Scale: Hurts little more Pain Location: back Pain Descriptors / Indicators: Discomfort;Guarding;Grimacing Pain Intervention(s): Limited activity within patient's tolerance;Monitored during session     Hand Dominance Right   Extremity/Trunk Assessment Upper Extremity Assessment Upper Extremity Assessment: Overall WFL for tasks assessed   Lower Extremity Assessment Lower Extremity Assessment: Defer to PT evaluation   Cervical / Trunk Assessment Cervical / Trunk Assessment: Normal   Communication Communication Communication: No  difficulties   Cognition Arousal/Alertness: Awake/alert Behavior During Therapy: WFL for tasks assessed/performed Overall Cognitive Status: Within Functional Limits for tasks assessed  General Comments  VSS on RA, pt recalled all precautions, demonstrated good ability to complete ADLs            Home Living Family/patient expects to be discharged to:: Private residence Living Arrangements: Spouse/significant other;Children;Other relatives Available Help at Discharge: Family;Available 24 hours/day Type of Home: House Home Access: Stairs to enter;Ramped entrance     Home Layout: One level     Bathroom Shower/Tub: Producer, television/film/video: Handicapped height Bathroom Accessibility: Yes How Accessible: Accessible via walker Home Equipment: Shower seat - built in          Prior Functioning/Environment Level of Independence: Independent        Comments: drives, works        OT Problem List: Decreased range of motion;Impaired balance (sitting and/or standing);Decreased activity tolerance;Decreased safety awareness;Decreased knowledge of use of DME or AE;Decreased knowledge of precautions;Pain      OT Treatment/Interventions:      OT Goals(Current goals can be found in the care plan section) Acute Rehab OT Goals Patient Stated Goal: home today OT Goal Formulation: All assessment and education complete, DC therapy   AM-PAC OT "6 Clicks" Daily Activity     Outcome Measure Help from another person eating meals?: None Help from another person taking care of personal grooming?: A Little Help from another person toileting, which includes using toliet, bedpan, or urinal?: A Little Help from another person bathing (including washing, rinsing, drying)?: A Little Help from another person to put on and taking off regular upper body clothing?: None Help from another person to put on and taking off regular lower body clothing?: A Little 6 Click Score: 20    End of Session Equipment Utilized During Treatment: Back brace Nurse Communication: Mobility status;Precautions;Weight bearing status  Activity Tolerance: Patient tolerated treatment well Patient left: in bed;with call bell/phone within reach  OT Visit Diagnosis: Other abnormalities of gait and mobility (R26.89);Pain                Time: 4627-0350 OT Time Calculation (min): 16 min Charges:  OT General Charges $OT Visit: 1 Visit OT Evaluation $OT Eval Low Complexity: 1 Low    Leonidas Boateng A Dalasia Predmore 11/04/2020, 8:20 AM

## 2020-11-04 NOTE — Plan of Care (Signed)
  Problem: Education: Goal: Ability to verbalize activity precautions or restrictions will improve Outcome: Progressing   Problem: Activity: Goal: Ability to avoid complications of mobility impairment will improve Outcome: Progressing   Problem: Activity: Goal: Ability to tolerate increased activity will improve Outcome: Progressing   Problem: Pain Management: Goal: Pain level will decrease Outcome: Progressing

## 2020-11-04 NOTE — Discharge Instructions (Signed)

## 2020-11-04 NOTE — Progress Notes (Signed)
Patient was transported via wheelchair by volunteer for discharge home; in no acute distress nor complaints of pain nor discomfort; room was checked and accounted for all her belongings; discharge instructions given to patient by RN and she verbalized understanding on the instructions given. 

## 2020-11-04 NOTE — Addendum Note (Signed)
Addendum  created 11/04/20 1556 by Dorris Singh, MD   Attestation recorded in Intraprocedure, Intraprocedure Attestations filed

## 2020-11-04 NOTE — Evaluation (Signed)
Physical Therapy Evaluation Patient Details Name: Jeriah Skufca MRN: [id number] DOB: 1965-12-16 Today's Date: 11/04/2020   History of Present Illness  Aubryn Spinola  55 y.o. female s/p Posterior Lumbar Interbody Fusion Lumbar three-four 8/8. PT with history of  progressive back pain with bilateral lower extremity radicular symptoms, anxiety, arthritis, CVA, depression  Clinical Impression  Patient evaluated by Physical Therapy with no further acute PT needs identified. All education has been completed and the patient has no further questions. Overall moving well, and education complete;  See below for any follow-up Physical Therapy or equipment needs. PT is signing off. Thank you for this referral.     Follow Up Recommendations No PT follow up    Equipment Recommendations  Rolling walker with 5" wheels    Recommendations for Other Services       Precautions / Restrictions Precautions Precautions: Back;Fall Precaution Booklet Issued: Yes (comment) Required Braces or Orthoses: Spinal Brace Spinal Brace: Lumbar corset Restrictions Other Position/Activity Restrictions: no lifting more than 10 lbs      Mobility  Bed Mobility Overal bed mobility: Needs Assistance Bed Mobility: Rolling;Sidelying to Sit;Sit to Sidelying Rolling: Supervision Sidelying to sit: Supervision;HOB elevated     Sit to sidelying: Supervision;HOB elevated General bed mobility comments: verbal cues throughout for log rolling - pt's bed at home elevates    Transfers Overall transfer level: Needs assistance   Transfers: Sit to/from Stand Sit to Stand: Supervision         General transfer comment: Cues for back prec  Ambulation/Gait Ambulation/Gait assistance: Supervision Gait Distance (Feet): 150 Feet Assistive device: None;Rolling walker (2 wheeled) Gait Pattern/deviations: Step-through pattern Gait velocity: slowed   General Gait Details: Tending to reach out for UE support on hallway rail  during amb; Walked halfway without RW and then back to room with RW; Seemed steadier and more confident with RW  Stairs            Wheelchair Mobility    Modified Rankin (Stroke Patients Only)       Balance     Sitting balance-Leahy Scale: Good       Standing balance-Leahy Scale: Fair                               Pertinent Vitals/Pain Pain Assessment: Faces Faces Pain Scale: Hurts little more Pain Location: back Pain Descriptors / Indicators: Discomfort;Guarding;Grimacing Pain Intervention(s): Monitored during session    Home Living Family/patient expects to be discharged to:: Private residence Living Arrangements: Spouse/significant other;Children;Other relatives Available Help at Discharge: Family;Available 24 hours/day Type of Home: House Home Access: Stairs to enter;Ramped entrance     Home Layout: One level Home Equipment: Shower seat - built in      Prior Function Level of Independence: Independent         Comments: drives, works; 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   Dominant Hand: Right    Extremity/Trunk Assessment   Upper Extremity Assessment Upper Extremity Assessment: Defer to OT evaluation    Lower Extremity Assessment Lower Extremity Assessment: Overall WFL for tasks assessed       Communication   Communication: No difficulties  Cognition Arousal/Alertness: Awake/alert Behavior During Therapy: WFL for tasks assessed/performed Overall Cognitive Status: Within Functional Limits for tasks assessed  General Comments General comments (skin integrity, edema, etc.): Husband present beginning of session    Exercises     Assessment/Plan    PT Assessment Patent does not need any further PT services  PT Problem List         PT Treatment Interventions      PT Goals (Current goals can be found in the Care Plan section)  Acute Rehab PT Goals Patient  Stated Goal: home today PT Goal Formulation: All assessment and education complete, DC therapy    Frequency     Barriers to discharge        Co-evaluation               AM-PAC PT "6 Clicks" Mobility  Outcome Measure Help needed turning from your back to your side while in a flat bed without using bedrails?: None Help needed moving from lying on your back to sitting on the side of a flat bed without using bedrails?: None Help needed moving to and from a bed to a chair (including a wheelchair)?: None Help needed standing up from a chair using your arms (e.g., wheelchair or bedside chair)?: None Help needed to walk in hospital room?: None Help needed climbing 3-5 steps with a railing? : A Little 6 Click Score: 23    End of Session Equipment Utilized During Treatment: Back brace Activity Tolerance: Patient tolerated treatment well Patient left: in bed;with call bell/phone within reach (sitting EOB) Nurse Communication: Mobility status (needs rW) PT Visit Diagnosis: Other abnormalities of gait and mobility (R26.89)    Time: 1023-1040 PT Time Calculation (min) (ACUTE ONLY): 17 min   Charges:   PT Evaluation $PT Eval Low Complexity: 1 Low          Van Clines, PT  Acute Rehabilitation Services Pager [phone] Office [phone]   Levi Aland 11/04/2020, 12:22 PM

## 2020-11-04 NOTE — Discharge Summary (Signed)
Physician Discharge Summary  Patient ID: Christine Perkins MRN: [id number] DOB/AGE: 54-03-1965 54 y.o.  Admit date: 11/03/2020 Discharge date: 11/04/2020  Admission Diagnoses:  Discharge Diagnoses:  Active Problems:   Degenerative spondylolisthesis   Discharged Condition: good  Hospital Course: Patient admitted to hospital where she underwent uncomplicated two-level lumbar decompression and fusion.  Postop Nedra Hai doing well.  Standing ambulating and voiding without difficulty.  Ready for discharge home.  Consults:   Significant Diagnostic Studies:   Treatments:   Discharge Exam: Blood pressure (!) 120/55, pulse 66, temperature 98.7 F (37.1 C), temperature source Oral, resp. rate 18, height 5\' 4"  (1.626 m), weight 81.6 kg, SpO2 96 %. Awake and alert.  Oriented and appropriate.  Motor and sensory function intact.  Wound clean dry and intact.  Chest and abdomen benign.  Extremities free from injury or deformity.  Disposition: Discharge disposition: 01-Home or Self Care        Allergies as of 11/04/2020   No Known Allergies      Medication List     TAKE these medications    ALPRAZolam 0.5 MG tablet Commonly known as: XANAX Take 0.25-0.5 mg by mouth 3 (three) times daily as needed for anxiety.   methocarbamol 500 MG tablet Commonly known as: Robaxin Take 1 tablet (500 mg total) by mouth 4 (four) times daily.   Oxycodone HCl 10 MG Tabs Take 1 tablet (10 mg total) by mouth every 3 (three) hours as needed for severe pain ((score 7 to 10)).   sertraline 50 MG tablet Commonly known as: ZOLOFT Take 50 mg by mouth daily.   tizanidine 6 MG capsule Commonly known as: ZANAFLEX Take 6 mg by mouth every 6 (six) hours as needed for muscle spasms.   vitamin B-12 1000 MCG tablet Commonly known as: CYANOCOBALAMIN Take 1,000 mcg by mouth daily.   Vitamin D 50 MCG (2000 UT) Caps Take 2,000 Units by mouth daily.               Durable Medical Equipment  (From admission,  onward)           Start     Ordered   11/03/20 1552  DME Walker rolling  Once       Question:  Patient needs a walker to treat with the following condition  Answer:  Degenerative spondylolisthesis   11/03/20 1551   11/03/20 1552  DME 3 n 1  Once        11/03/20 1551             Signed: 01/03/21 A Aahan Marques 11/04/2020, 10:18 AM

## 2020-11-05 MED FILL — Heparin Sodium (Porcine) Inj 1000 Unit/ML: INTRAMUSCULAR | Qty: 30 | Status: AC

## 2020-11-05 MED FILL — Sodium Chloride IV Soln 0.9%: INTRAVENOUS | Qty: 1000 | Status: AC

## 2021-12-21 ENCOUNTER — Encounter: Payer: Self-pay | Admitting: *Deleted

## 2022-03-11 ENCOUNTER — Encounter: Payer: Self-pay | Admitting: *Deleted

## 2023-03-28 IMAGING — RF DG LUMBAR SPINE 2-3V
1 series · 3 of 3 positions shown · non-contrast
Comparison: Lumbar spine MRI 09/10/2020.

CLINICAL DATA: Elective surgery OC7.Y ([id number]). Additional
history provided by technologist: Posterior lumbar interbody fusion
lumbar 3-4, lumbar 4-5. Provided fluoroscopy time: 41 seconds (26.03
mGy).

EXAM:
LUMBAR SPINE - 2-3 VIEW; DG C-ARM 1-60 MIN

[Series 1: run · 3 of 3 slices shown]
[im 1/3]
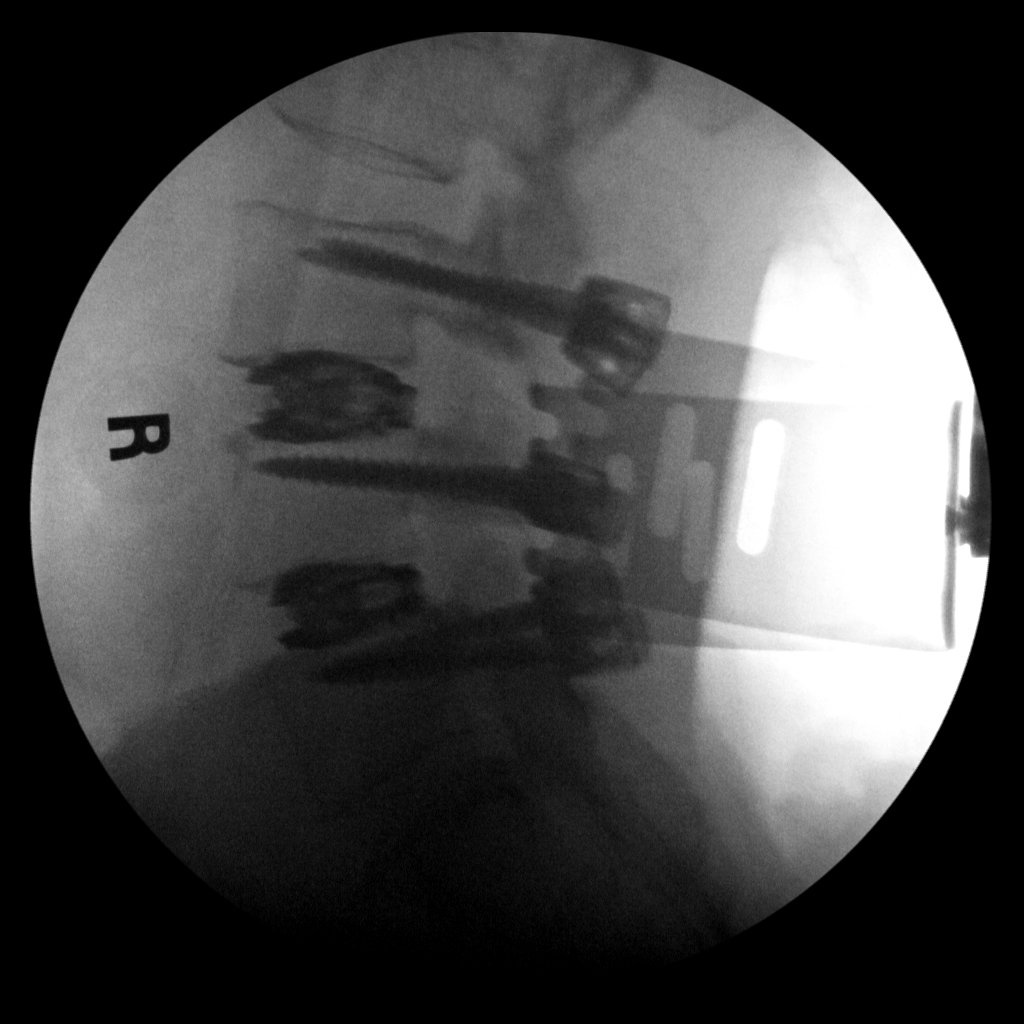
[im 2/3]
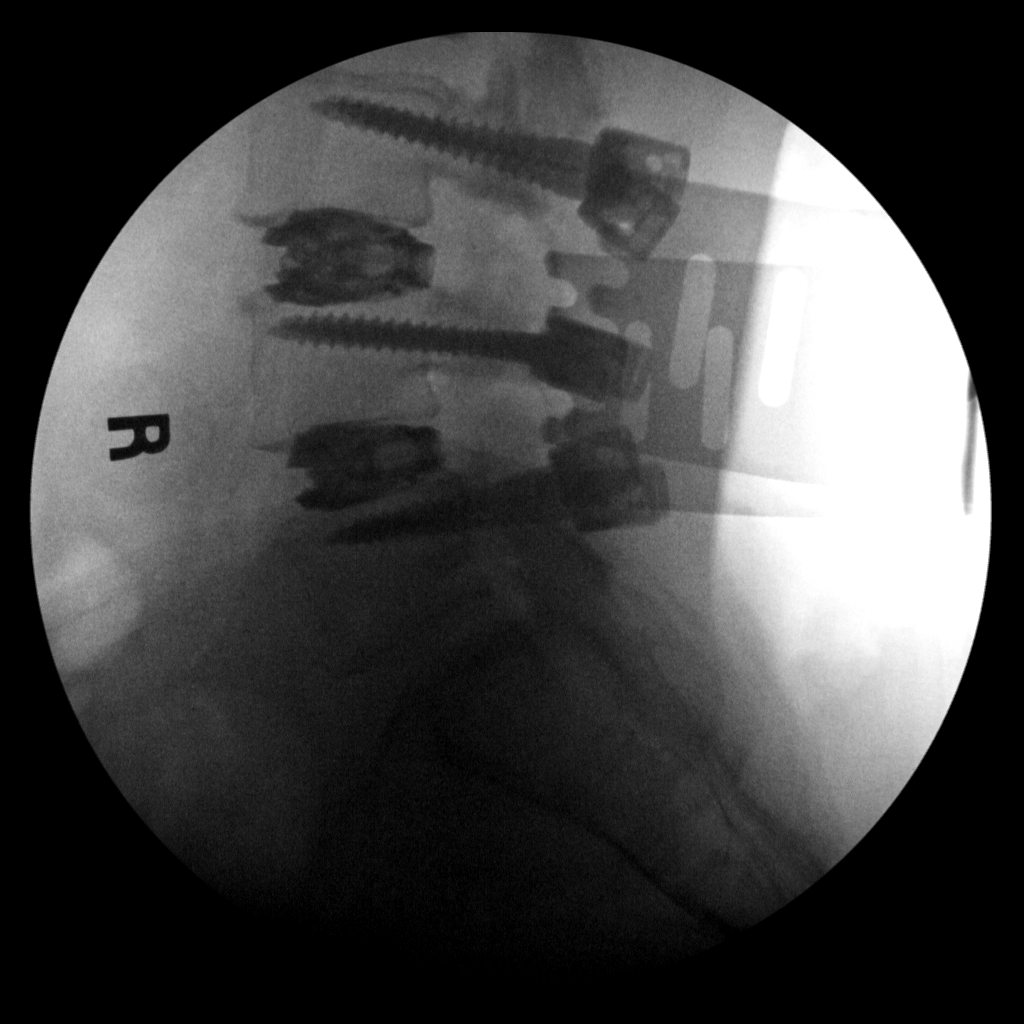
[im 3/3]
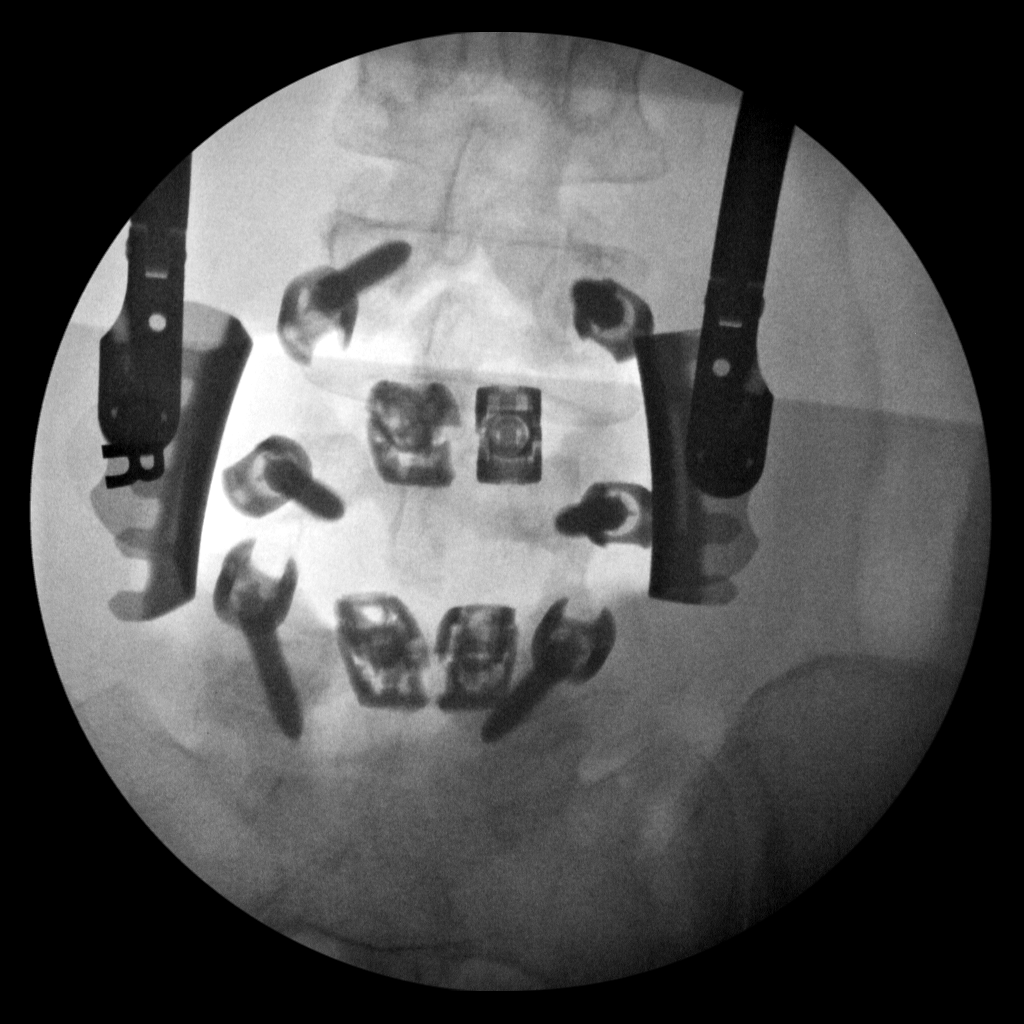

[3 of 3 positions shown; findings below may reference images not displayed]

FINDINGS: AP and lateral view intraoperative fluoroscopic images of the
lumbosacral spine are submitted, 3 images total. The lowest
well-formed intervertebral disc space is designated L5-S1. On the
provided images, bilateral pedicle screws are present at L3, L4 and
L5. Vertical interconnecting rods were not present at the time the
images were taken. Interbody devices also now present at L3-L4 and
L4-L5. Overlying retractors.
IMPRESSION: Two intraoperative fluoroscopic images of the lumbosacral spine from
L3-L5 posterior fusion, as described.
